# Patient Record
Sex: Female | Born: 1960 | Race: Black or African American | Hispanic: No | Smoking: Never smoker
Health system: Southern US, Community
[De-identification: ages and names within clinical notes are randomized; demographics above are authoritative.]

## PROBLEM LIST (undated history)

## (undated) DIAGNOSIS — F32A Depression, unspecified: Secondary | ICD-10-CM

## (undated) DIAGNOSIS — F419 Anxiety disorder, unspecified: Secondary | ICD-10-CM

## (undated) DIAGNOSIS — J449 Chronic obstructive pulmonary disease, unspecified: Secondary | ICD-10-CM

## (undated) DIAGNOSIS — T7840XA Allergy, unspecified, initial encounter: Secondary | ICD-10-CM

## (undated) DIAGNOSIS — M1990 Unspecified osteoarthritis, unspecified site: Secondary | ICD-10-CM

## (undated) DIAGNOSIS — D649 Anemia, unspecified: Secondary | ICD-10-CM

## (undated) HISTORY — DX: Unspecified osteoarthritis, unspecified site: M19.90

## (undated) HISTORY — DX: Chronic obstructive pulmonary disease, unspecified: J44.9

## (undated) HISTORY — DX: Anxiety disorder, unspecified: F41.9

## (undated) HISTORY — DX: Allergy, unspecified, initial encounter: T78.40XA

## (undated) HISTORY — PX: BREAST CYST ASPIRATION: SHX578

## (undated) HISTORY — DX: Anemia, unspecified: D64.9

## (undated) HISTORY — DX: Depression, unspecified: F32.A

## (undated) HISTORY — PX: TUBAL LIGATION: SHX77

## (undated) HISTORY — PX: ABDOMINAL HYSTERECTOMY: SHX81

---

## 1997-11-20 ENCOUNTER — Ambulatory Visit (HOSPITAL_COMMUNITY): Admission: RE | Admit: 1997-11-20 | Discharge: 1997-11-20 | Payer: Self-pay | Admitting: Family Medicine

## 1997-12-05 ENCOUNTER — Ambulatory Visit (HOSPITAL_COMMUNITY): Admission: RE | Admit: 1997-12-05 | Discharge: 1997-12-05 | Payer: Self-pay | Admitting: *Deleted

## 2010-06-17 ENCOUNTER — Emergency Department (HOSPITAL_COMMUNITY)
Admission: EM | Admit: 2010-06-17 | Discharge: 2010-06-18 | Payer: Self-pay | Source: Home / Self Care | Admitting: Emergency Medicine

## 2010-07-07 ENCOUNTER — Emergency Department (HOSPITAL_COMMUNITY)
Admission: EM | Admit: 2010-07-07 | Discharge: 2010-07-07 | Payer: Self-pay | Source: Home / Self Care | Admitting: Emergency Medicine

## 2010-07-08 LAB — CBC
HCT: 40.3 % (ref 36.0–46.0)
Hemoglobin: 13.1 g/dL (ref 12.0–15.0)
MCH: 30.5 pg (ref 26.0–34.0)
MCHC: 32.5 g/dL (ref 30.0–36.0)
MCV: 93.7 fL (ref 78.0–100.0)
Platelets: 322 10*3/uL (ref 150–400)
RBC: 4.3 MIL/uL (ref 3.87–5.11)
RDW: 12.6 % (ref 11.5–15.5)
WBC: 7.9 10*3/uL (ref 4.0–10.5)

## 2010-07-08 LAB — POCT CARDIAC MARKERS
CKMB, poc: <1 ng/mL — ABNORMAL LOW (ref 1.0–8.0)
Myoglobin, poc: 30.6 ng/mL (ref 12–200)
Troponin i, poc: <0.05 ng/mL (ref 0.00–0.09)

## 2010-07-08 LAB — BASIC METABOLIC PANEL
BUN: 11 mg/dL (ref 6–23)
CO2: 28 mEq/L (ref 19–32)
Calcium: 9.3 mg/dL (ref 8.4–10.5)
Chloride: 100 mEq/L (ref 96–112)
Creatinine, Ser: 0.67 mg/dL (ref 0.4–1.2)
GFR calc Af Amer: >60 mL/min (ref >60)
GFR calc non Af Amer: >60 mL/min (ref >60)
Glucose, Bld: 98 mg/dL (ref 70–99)
Potassium: 4 mEq/L (ref 3.5–5.1)
Sodium: 134 mEq/L — ABNORMAL LOW (ref 135–145)

## 2010-07-08 LAB — DIFFERENTIAL
Basophils Absolute: 0.1 10*3/uL (ref 0.0–0.1)
Basophils Relative: 1 % (ref 0–1)
Eosinophils Absolute: 0.8 10*3/uL — ABNORMAL HIGH (ref 0.0–0.7)
Eosinophils Relative: 10 % — ABNORMAL HIGH (ref 0–5)
Lymphocytes Relative: 27 % (ref 12–46)
Lymphs Abs: 2.1 10*3/uL (ref 0.7–4.0)
Monocytes Absolute: 0.6 10*3/uL (ref 0.1–1.0)
Monocytes Relative: 8 % (ref 3–12)
Neutro Abs: 4.4 10*3/uL (ref 1.7–7.7)
Neutrophils Relative %: 55 % (ref 43–77)

## 2010-08-31 LAB — BASIC METABOLIC PANEL
BUN: 19 mg/dL (ref 6–23)
CO2: 27 mEq/L (ref 19–32)
Calcium: 9.1 mg/dL (ref 8.4–10.5)
Chloride: 105 mEq/L (ref 96–112)
Creatinine, Ser: 0.5 mg/dL (ref 0.4–1.2)
GFR calc Af Amer: >60 mL/min (ref >60)
GFR calc non Af Amer: >60 mL/min (ref >60)
Glucose, Bld: 98 mg/dL (ref 70–99)
Potassium: 3.8 mEq/L (ref 3.5–5.1)
Sodium: 138 mEq/L (ref 135–145)

## 2010-08-31 LAB — CBC
HCT: 36.4 % (ref 36.0–46.0)
Hemoglobin: 12.1 g/dL (ref 12.0–15.0)
MCH: 31.8 pg (ref 26.0–34.0)
MCHC: 33.2 g/dL (ref 30.0–36.0)
MCV: 95.8 fL (ref 78.0–100.0)
Platelets: 296 K/uL (ref 150–400)
RBC: 3.8 MIL/uL — ABNORMAL LOW (ref 3.87–5.11)
RDW: 12.6 % (ref 11.5–15.5)
WBC: 7.1 K/uL (ref 4.0–10.5)

## 2013-09-27 ENCOUNTER — Emergency Department (HOSPITAL_COMMUNITY): Admission: EM | Admit: 2013-09-27 | Discharge: 2013-09-27 | Discharge status: Against Medical Advice | Payer: Self-pay

## 2013-09-27 ENCOUNTER — Emergency Department (HOSPITAL_COMMUNITY)
Admission: EM | Admit: 2013-09-27 | Discharge: 2013-09-27 | Disposition: A | Discharge status: Home / Self Care | Payer: Self-pay | Attending: Emergency Medicine | Admitting: Emergency Medicine

## 2013-09-27 ENCOUNTER — Emergency Department (HOSPITAL_COMMUNITY): Payer: Self-pay

## 2013-09-27 ENCOUNTER — Encounter (HOSPITAL_COMMUNITY): Payer: Self-pay | Admitting: Emergency Medicine

## 2013-09-27 DIAGNOSIS — H53149 Visual discomfort, unspecified: Secondary | ICD-10-CM | POA: Insufficient documentation

## 2013-09-27 DIAGNOSIS — H0520 Unspecified exophthalmos: Secondary | ICD-10-CM | POA: Insufficient documentation

## 2013-09-27 DIAGNOSIS — H5789 Other specified disorders of eye and adnexa: Secondary | ICD-10-CM | POA: Insufficient documentation

## 2013-09-27 DIAGNOSIS — Y93E8 Activity, other personal hygiene: Secondary | ICD-10-CM | POA: Insufficient documentation

## 2013-09-27 DIAGNOSIS — Z7982 Long term (current) use of aspirin: Secondary | ICD-10-CM | POA: Insufficient documentation

## 2013-09-27 DIAGNOSIS — S0502XA Injury of conjunctiva and corneal abrasion without foreign body, left eye, initial encounter: Secondary | ICD-10-CM

## 2013-09-27 DIAGNOSIS — X58XXXA Exposure to other specified factors, initial encounter: Secondary | ICD-10-CM | POA: Insufficient documentation

## 2013-09-27 DIAGNOSIS — S058X9A Other injuries of unspecified eye and orbit, initial encounter: Secondary | ICD-10-CM | POA: Insufficient documentation

## 2013-09-27 DIAGNOSIS — Y929 Unspecified place or not applicable: Secondary | ICD-10-CM | POA: Insufficient documentation

## 2013-09-27 DIAGNOSIS — H5712 Ocular pain, left eye: Secondary | ICD-10-CM

## 2013-09-27 MED ORDER — MOXIFLOXACIN HCL 0.5 % OP SOLN: 1.0000 [drp] | Freq: Three times a day (TID) | OPHTHALMIC | Status: AC

## 2013-09-27 MED ORDER — FLUORESCEIN SODIUM 1 MG OP STRP
1.0000 | ORAL_STRIP | Freq: Once | OPHTHALMIC | Status: AC
  Administered 2013-09-27: 1 via OPHTHALMIC
  Filled 2013-09-27: qty 1

## 2013-09-27 MED ORDER — TETRACAINE HCL 0.5 % OP SOLN
2.0000 [drp] | Freq: Once | OPHTHALMIC | Status: AC
  Administered 2013-09-27: 2 [drp] via OPHTHALMIC
  Filled 2013-09-27: qty 2

## 2013-09-27 NOTE — Discharge Instructions (Signed)
Today you were evaluated for left eye pain and found to have a corneal abrasion which should be treated with antibiotic drops that were prescribed today. You may take tylenol and ibuprofen as needed for pain.  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 This could indicate thyroid problems.  It is important to follow up with a primary care provider to have thyroid levels and other basic blood work checked, see resource guide below if you do not have one.   If symptoms not improving in 2-3 days, follow up with Dr. Allyne GeeSanders, ophthalmology.     Emergency Department Resource Guide 1) Find a Doctor and Pay Out of Pocket Although you won't have to find out who is covered by your insurance plan, it is a good idea to ask around and get recommendations. You will then need to call the office and see if the doctor you have chosen will accept you as a new patient and what types of options they offer for patients who are self-pay. Some doctors offer discounts or will set up payment plans for their patients who do not have insurance, but you will need to ask so you aren't surprised when you get to your appointment.  2) Contact Your Local Health Department Not all health departments have doctors that can see patients for sick visits, but many do, so it is worth a call to see if yours does. If you don't know where your local health department is, you can check in your phone book. The CDC also has a tool to help you locate your state's health department, and many state websites also have listings of all of their local health departments.  3) Find a Walk-in Clinic If your illness is not likely to be very severe or complicated, you may want to try a walk in clinic. These are popping up all over the country in pharmacies, drugstores, and shopping centers. They're usually staffed by nurse practitioners or physician assistants that have been trained to treat common illnesses and complaints.  They're usually fairly quick and inexpensive. However, if you have serious medical issues or chronic medical problems, these are probably not your best option.  No Primary Care Doctor: - Call Health Connect at  [phone] - they can help you locate a primary care doctor that  accepts your insurance, provides certain services, etc. - Physician Referral Service- [phone]  Chronic Pain Problems: Organization         Address  Phone   Notes  [facility]  [phone] Patients need to be referred by their primary care doctor.   Medication Assistance: Organization         Address  Phone   Notes  [facility] [address] [place], [place] [address] [phone] --Must be a resident of [place] -- Must have NO insurance coverage whatsoever (no Medicaid/ Medicare, etc.) -- The pt. MUST have a primary care doctor that directs their care regularly and follows them in the community   [facility]  [phone]   [employer]  [phone]    Agencies that provide inexpensive medical care: Organization         Address  Phone                                                                            Notes  [facility]  [phone]   [facility]    [phone]   [facility] [address] [place], [place] [address] [phone]   [facility] [address], [place] [phone]   Planned Parenthood    [phone]   [facility]    [phone]   [facility] and [facility]  [address], [place] Phone:  [phone], Fax:  [phone] Hours of Operation:  9 am - 6 pm, M-F.  Also accepts Medicaid/Medicare and self-pay.  [facility]  [address], [place] Phone: [phone], Fax: [phone]. Hours of  Operation:  8:30 am - 5:30 pm, M-F.  Also accepts Medicaid and self-pay.  [facility] [place] [address], [place] Phone: [phone]   [facility] [address] Natasha Bence [place], [place] [phone], Ext. [phone] Mondays & Thursdays: 7-9 AM.  First 15 patients are seen on a first come, first serve basis.    Medicaid-accepting [facility] Providers:  Organization         Address                                                                       Phone                               Notes  [facility] [address], [place] [phone] Also accepts self-pay patients.  [facility] [address] Laurell Josephs [place], [place]  [phone]   [facility] [address], [place] [phone]   [facility] [address] [place] [phone]   Renaye Rakers [address], [place]   [phone] Only accepts [place] patients after they have their name applied to their card.   Self-Pay (no insurance) in [facility]:   Organization         Address                                                     Phone               Notes  Sickle Cell Patients, [facility] [address] [place], [place] [phone]   [facility] [address] [place], [place] [phone]   [facility] [place]  [address], [place] [phone]   [facility]/Dr. Osei-Bonsu  [address] [place] [address], [place] or [address], [place] [phone] Phone number for both [place] and [place] locations is the same.  [facility] and [facility] [address], [place] [phone]   [facility] [address] [place] [address], [place] or [address] [phone]   [facility] [address], [place] [phone]  [phone], phone; [phone], fax Sees patients 1st and 3rd Saturday of every month.  Must not qualify for public or private insurance (i.e. Medicaid, Medicare, [place] Health Choice, Veterans' Benefits)  Household income should be no more than 200% of the poverty level The clinic cannot treat you if you are pregnant or think you are pregnant  Sexually transmitted diseases are not treated at the clinic.    Dental Care: Organization         Address                                  Phone                       Notes  [facility] [address], [place] [phone] Accepts children up to age 51 who are enrolled in [place] or [place] Health Choice; pregnant women with a Medicaid card; and children who have applied for Medicaid or [place] Health Choice, but were declined, whose parents can pay a reduced fee at time of service.  [facility] [place]  [address], [place] [phone] Accepts children up to age 51 who are enrolled in [place] or [place] Health Choice; pregnant women with a Medicaid card; and children who have applied for Medicaid or [place] Health Choice, but were declined, whose parents can pay a reduced fee at time of service.  [facility]  [address], [place] [phone] Patients are seen by appointment only. Walk-ins are not accepted. [facility] will see patients 51 years of age and older. Monday - Tuesday (8am-5pm) Most Wednesdays (8:30-5pm) $30 per visit, cash only  [facility]  [address], [facility] [place] [phone] Patients are seen by appointment only. Walk-ins are not accepted. [facility] will see patients 51 years of age and older. One Wednesday Evening (Monthly: Volunteer Based).  $30 per visit, cash only  [facility]  [phone] for adults; Children under age 534, call Graduate  Pediatric Dentistry at [phone]. Children aged 704-14, please call [phone] to request a pediatric application.  Dental services are provided in all areas of dental care including fillings, crowns and bridges, complete and partial dentures, implants, gum treatment, root canals, and extractions. Preventive care is also provided. Treatment is provided to both adults and children. Patients are selected via a lottery and there is often a waiting list.   [facility] [address], [place]  [phone] [email or web address]   [facility] [address], [place], [place] [phone], Ext. [phone] Second and Fourth Thursday of each month, opens at 6:30 AM; Clinic ends at 9 AM.  Patients are seen on a first-come first-served basis, and a limited number are seen during each clinic.   [facility]  [address], [place], [place] [phone]   Eligibility Requirements You must have lived in [place], [place], or [place] for at least the last three months.   You cannot be eligible for state or federal sponsored [place] Cityhealthcare insurance, including CIGNAVeterans Administration, [place]icaid, or [employer]icare.   You generally cannot be eligible for healthcare insurance through your employer.    How to apply: Eligibility screenings are held every Tuesday and Wednesday afternoon from 1:00 pm until 4:00 pm. You do not need an appointment for the interview!  [facility] [address], [place], [place] [phone]   [facility]  [phone]   [facility]  [phone]   [facility]  [phone]

## 2013-09-27 NOTE — ED Notes (Signed)
Patient went to CT - okay'd by [place]rin, [place].   Patient is back at this time.

## 2013-09-27 NOTE — ED Notes (Signed)
Pt states that her eye popped out and that it popped back in on way other. Eye is red. States she feels like it is burning

## 2013-09-27 NOTE — ED Provider Notes (Signed)
CSN: [id number]     Arrival date & time 09/27/13  0534 History   First MD Initiated Contact with Patient 09/27/13 [phone]     Chief Complaint  Patient presents with  . Eye Problem     (Consider location/radiation/quality/duration/timing/severity/associated sxs/prior Treatment) HPI Pt is a 53yo female c/o left eye pain that started this morning while she was washing her face. Pt states she lowered her left lower eyelid when her "eye popped out and popped back in" on way over to the ER.  Pt states pain is constant, throbbing, 8/10, worse with eye movement. Pt states it feels like there is sand in her eye.  She has not tried anything for pain prior to arrival. Pt states light also makes pain worse.  She does not wear contacts and denies direct trauma to eye. Pt does wear reading glasses.  Denies hx of similar symptoms.   No past medical history on file. No past surgical history on file. No family history on file. History  Substance Use Topics  . Smoking status: Never Smoker   . Smokeless tobacco: Never Used  . Alcohol Use: No   OB History   Grav Para Term Preterm Abortions TAB SAB Ect Mult Living                 Review of Systems  Eyes: Positive for photophobia, pain, redness and visual disturbance. Negative for discharge and itching.  Neurological: Negative for dizziness, light-headedness and headaches.  All other systems reviewed and are negative.     Allergies  Bactrim and Flagyl  Home Medications   Current Outpatient Rx  Name  Route  Sig  Dispense  Refill  . aspirin EC 81 MG tablet   Oral   Take 324 mg by mouth daily as needed.          . Aspirin-Salicylamide-Caffeine (BC HEADACHE POWDER PO)   Oral   Take 1 packet by mouth 2 (two) times daily as needed (headache).         . moxifloxacin (VIGAMOX) 0.5 % ophthalmic solution   Ophthalmic   Apply 1 drop to eye 3 (three) times daily. Place one drop in affected eye 3 times daily for 7 days   3 mL   0    BP 126/69   Pulse 66  Temp(Src) 98.3 F (36.8 C) (Oral)  Resp 18  SpO2 100% Physical Exam  Nursing note and vitals reviewed. Constitutional: She is oriented to person, place, and time. She appears well-developed and well-nourished.  HENT:  Head: Normocephalic and atraumatic.  Eyes: EOM and lids are normal. Pupils are equal, round, and reactive to light. Lids are everted and swept, no foreign bodies found. Right eye exhibits no discharge. Left eye exhibits no discharge. Right conjunctiva is injected. Right conjunctiva has no hemorrhage. Left conjunctiva is injected. Left conjunctiva has no hemorrhage. No scleral icterus. Right eye exhibits normal extraocular motion and no nystagmus. Left eye exhibits normal extraocular motion ( pain with eye movement) and no nystagmus.  Slit lamp exam:      The left eye shows corneal abrasion and fluorescein uptake.  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 L eye not more proctotic than R eye.  Tono pen: Difficult having pt cooperate for exam  Right: 21, 30, 27  Left: 50, 19,17   Neck: Normal range of motion. Neck supple.  Cardiovascular: Normal rate.   Pulmonary/Chest: Effort normal.  Musculoskeletal: Normal range of motion.  Lymphadenopathy:    She has no cervical  adenopathy.  Neurological: She is alert and oriented to person, place, and time.  Skin: Skin is warm and dry.  Psychiatric: She has a normal mood and affect. Her behavior is normal.    ED Course  Procedures (including critical care time) Labs Review Labs Reviewed - No data to display Imaging Review Ct Orbitss W/o Cm  09/27/2013   CLINICAL DATA:  53 year old female with eye pain. Initial encounter.  EXAM: CT ORBITS WITHOUT CONTRAST  TECHNIQUE: Multidetector CT imaging of the orbits was performed following the standard protocol without intravenous contrast.  COMPARISON:  None.  FINDINGS: Visualized paranasal sinuses and mastoids are clear. Negative skull and facial bones. Degenerative changes in the visible  upper cervical spine (right side facet arthropathy). Visualized scalp soft tissues are within normal limits. Negative visualized deep soft tissue spaces of the face. Visible non contrast brain parenchyma grossly normal.  Leftward gaze deviation. Globes are intact. No periorbital or intraorbital soft tissue stranding identified. Orbital walls are intact. Lacrimal glands within normal limits. No orbital soft tissue abnormality identified.  IMPRESSION: Negative noncontrast CT of the orbits.   Electronically Signed   By: Augusto Gamble M.D.   On: 09/27/2013 10:28     EKG Interpretation None      MDM   Final diagnoses:  Exophthalmos  Left eye pain  Injury of conjunctiva and corneal abrasion of left eye w/o FB    Pt c/o left eye pain after it "popped out" and then "back in" just PTA.  Denies trauma to eye.  On exam, EOM difficult for pt in left eye as pt has photosensitivity.  Corneal abrasion seen on exam.  Eye pressure difficult to assess given pt's inability to keep eyes open due to photosensitivity.    Consulted with Dr. Allyne Gee, ophthalmology who recommended treating corneal abrasion of left eye with Vigamox. No other tx is needed for eyes at this time. Recommends pt have thyroid levels checked by PCP as pt has bilateral exophthalmus. Advised to f/u with Dr. Allyne Gee if symptoms not improving. Return precautions provided. Pt verbalized understanding and agreement with tx plan.   Junius Finner, PA-C 09/27/13 1502

## 2013-09-27 NOTE — ED Notes (Signed)
DO NOT LET PATIENT GO TO CT until Erin, [place] has talked with opthalmology.

## 2013-09-27 NOTE — Discharge Planning (Signed)
[facility] Erin Doyle, [employer] Liaison  Spoke to patient about primary care resources and establishing care with a provider. Patient was given the orange card application and resource guide. My contact information was also provided for any future questions or concerns.

## 2013-09-28 NOTE — ED Provider Notes (Signed)
Medical screening examination/treatment/procedure(s) were performed by non-physician practitioner and as supervising physician I was immediately available for consultation/collaboration.   EKG Interpretation None       Teara Duerksen K Mekenna Finau-Rasch, MD 09/28/13 2302 

## 2015-10-25 IMAGING — CT CT ORBITS W/O CM
3 series · 13 of 47 positions shown, 15 images · non-contrast
Comparison: None.

CLINICAL DATA: 52-year-old female with eye pain. Initial encounter.

EXAM:
CT ORBITS WITHOUT CONTRAST
TECHNIQUE: Multidetector CT imaging of the orbits was performed following the
standard protocol without intravenous contrast.

[Series 2: facial/ orbits 2.0 h30s · axial · 0.38mm/px · z∈[-150,-82]mm · 7 of 42 slices shown, 9 images]
[im 5/42  brain]
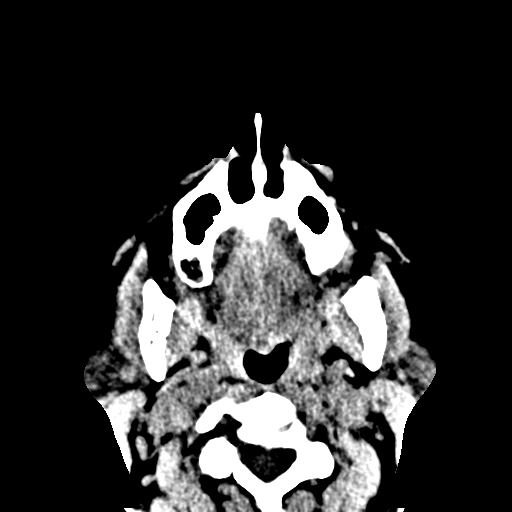
[im 5/42  bone]
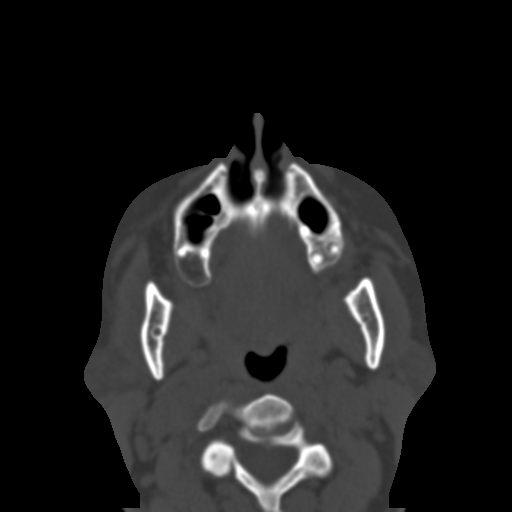
[im 10/42  bone]
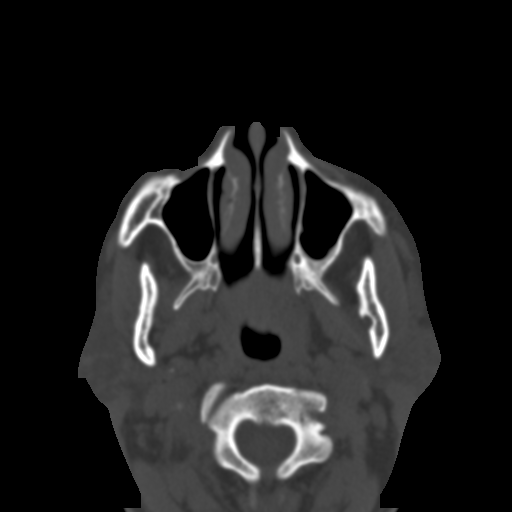
[im 16/42  bone]
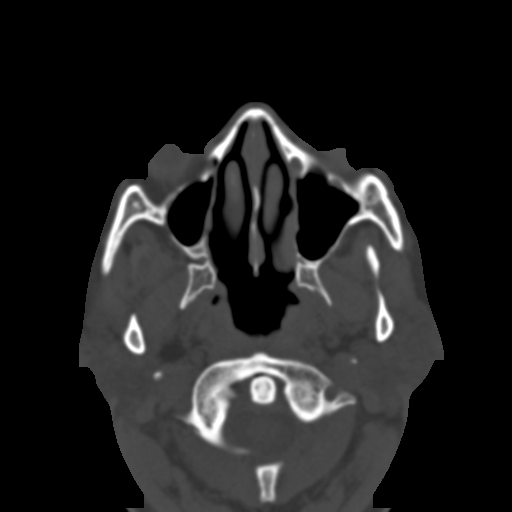
[im 22/42  bone]
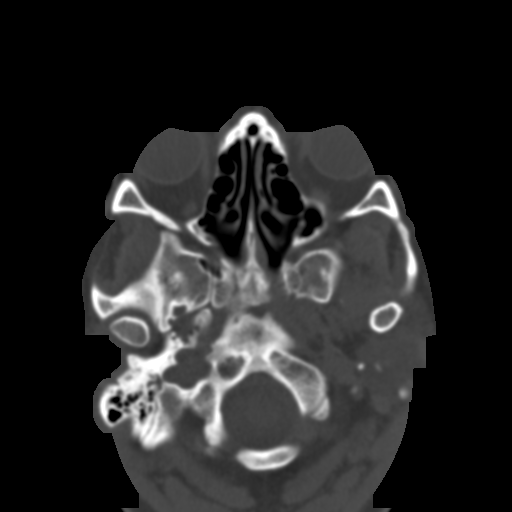
[im 27/42  brain]
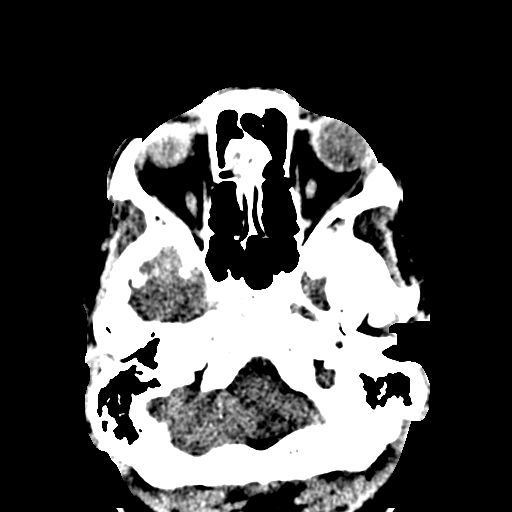
[im 27/42  bone]
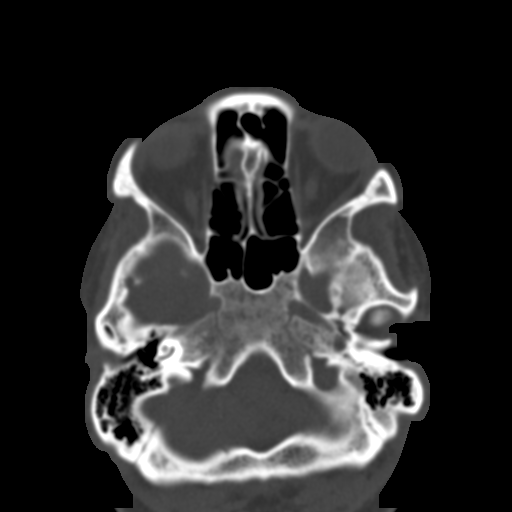
[im 33/42  bone]
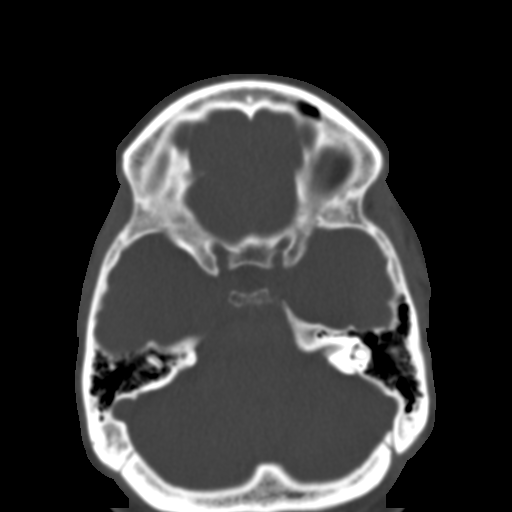
[im 39/42  bone]
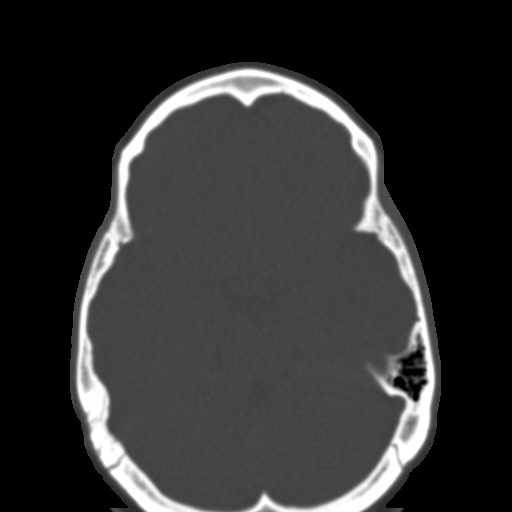

[Series 4: coronal st · coronal · 0.19mm/px · 3 of 76 slices shown]
[im 26/76  bone]
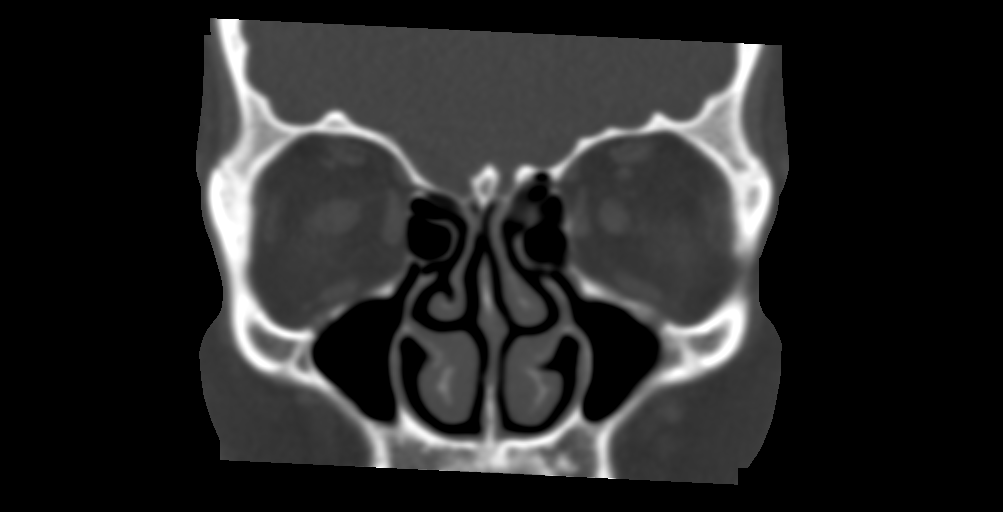
[im 34/76  bone]
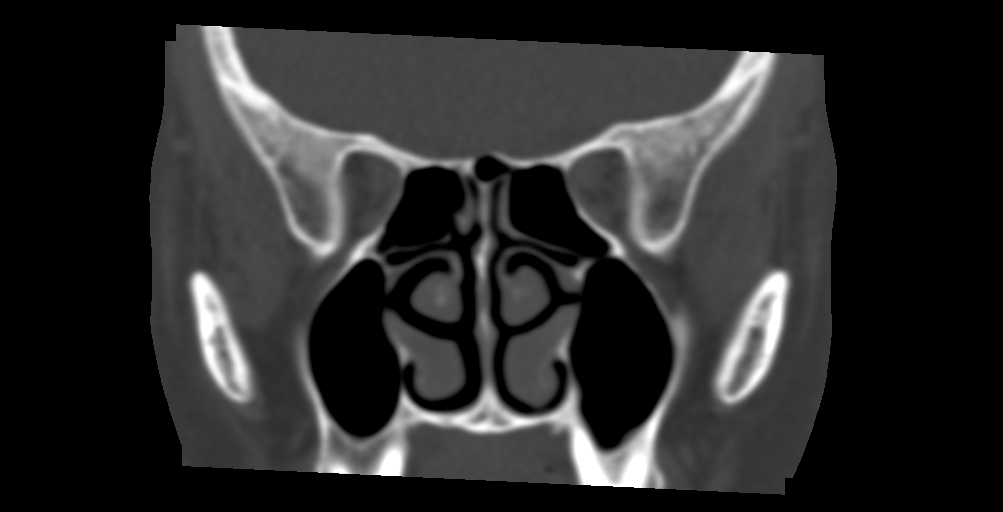
[im 42/76  bone]
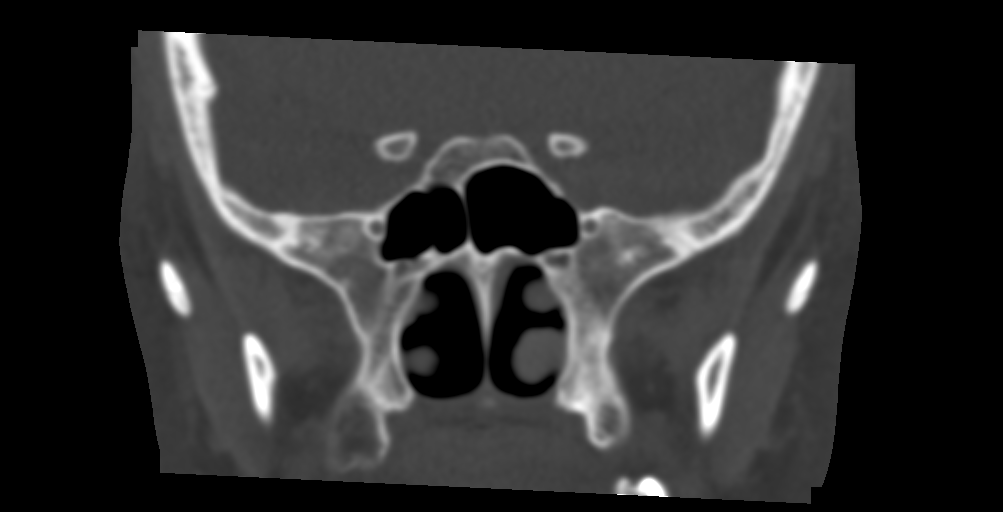

[Series 5: sagittal st · sagittal · 0.19mm/px · 3 of 78 slices shown]
[im 26/78  bone]
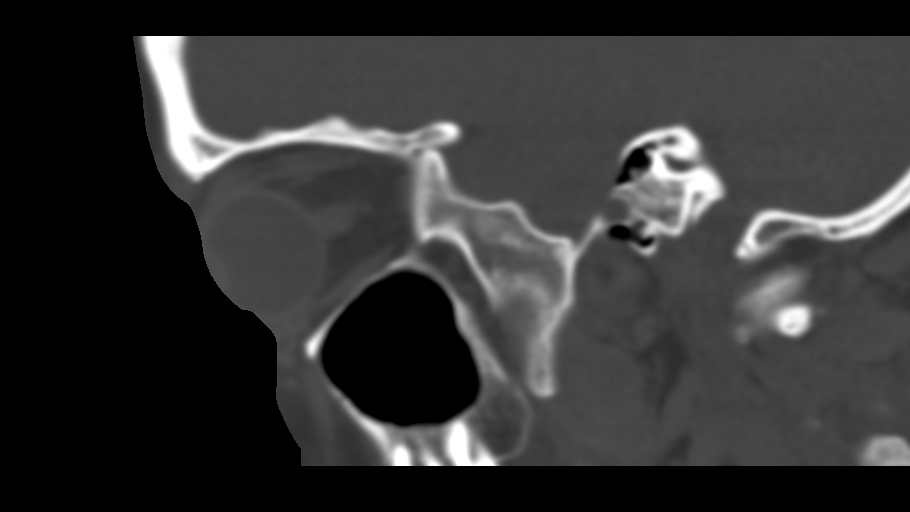
[im 39/78  bone]
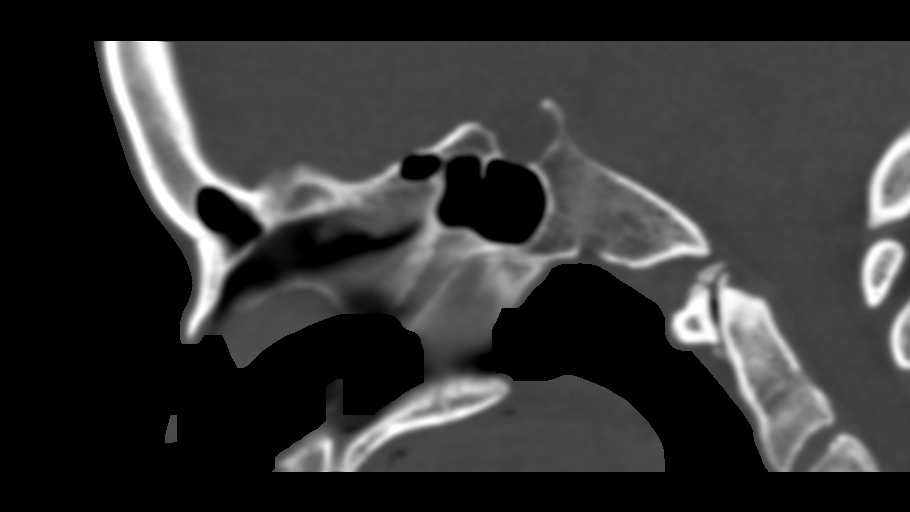
[im 52/78  bone]
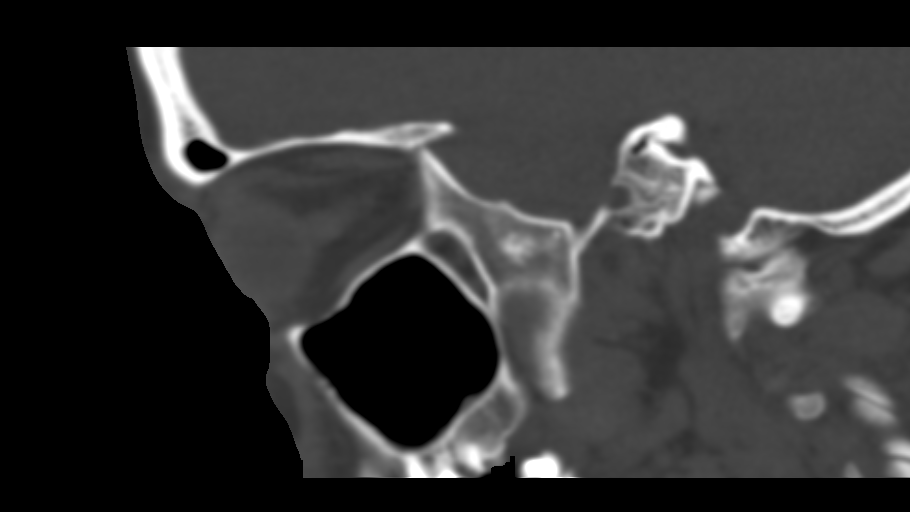

[13 of 47 positions shown; findings below may reference images not displayed]

FINDINGS: Visualized paranasal sinuses and mastoids are clear. Negative skull
and facial bones. Degenerative changes in the visible upper cervical
spine (right side facet arthropathy). Visualized scalp soft tissues
are within normal limits. Negative visualized deep soft tissue
spaces of the face. Visible non contrast brain parenchyma grossly
normal.

Leftward gaze deviation. Globes are intact. No periorbital or
intraorbital soft tissue stranding identified. Orbital walls are
intact. Lacrimal glands within normal limits. No orbital soft tissue
abnormality identified.
IMPRESSION: Negative noncontrast CT of the orbits.

## 2019-09-28 ENCOUNTER — Ambulatory Visit: Payer: Self-pay | Attending: Internal Medicine

## 2019-09-28 DIAGNOSIS — Z23 Encounter for immunization: Secondary | ICD-10-CM

## 2019-09-28 NOTE — Progress Notes (Signed)
   Covid-19 Vaccination Clinic  Name:  Erin Doyle    MRN: [id number] DOB: [birth year]  09/28/2019  [name] was observed post Covid-19 immunization for 15 minutes without incident. She was provided with Vaccine Information Sheet and instruction to access the V-Safe system.   [name] was instructed to call 911 with any severe reactions post vaccine: Marland Kitchen Difficulty breathing  . Swelling of face and throat  . A fast heartbeat  . A bad rash all over body  . Dizziness and weakness   Immunizations Administered    Name Date Dose VIS Date Route   Pfizer COVID-19 Vaccine 09/28/2019 10:10 AM 0.3 mL 06/01/2019 Intramuscular   Manufacturer: [employer], [employer]   Lot: [id number]   NDC: [address]-7

## 2019-10-22 ENCOUNTER — Ambulatory Visit: Payer: Self-pay | Attending: Internal Medicine

## 2019-10-22 DIAGNOSIS — Z23 Encounter for immunization: Secondary | ICD-10-CM

## 2019-10-22 NOTE — Progress Notes (Signed)
   Covid-19 Vaccination Clinic  Name:  Kasie Leccese    MRN: [id number] DOB: 12/28/1960  10/22/2019  Ms. Haynes was observed post Covid-19 immunization for 15 minutes without incident. She was provided with Vaccine Information Sheet and instruction to access the V-Safe system.   Ms. Fuquay was instructed to call 911 with any severe reactions post vaccine: Marland Kitchen Difficulty breathing  . Swelling of face and throat  . A fast heartbeat  . A bad rash all over body  . Dizziness and weakness   Immunizations Administered    Name Date Dose VIS Date Route   Pfizer COVID-19 Vaccine 10/22/2019 10:43 AM 0.3 mL 08/15/2018 Intramuscular   Manufacturer: [employer], [employer]   Lot: [id number]   NDC: [address]-2

## 2023-12-26 ENCOUNTER — Other Ambulatory Visit (HOSPITAL_COMMUNITY): Payer: Self-pay

## 2024-04-02 ENCOUNTER — Ambulatory Visit (INDEPENDENT_AMBULATORY_CARE_PROVIDER_SITE_OTHER): Payer: Self-pay | Admitting: Nurse Practitioner

## 2024-04-02 ENCOUNTER — Encounter: Payer: Self-pay | Admitting: Nurse Practitioner

## 2024-04-02 VITALS — BP 164/99 | HR 78 | Ht 62.0 in | Wt 175.0 lb

## 2024-04-02 DIAGNOSIS — Z23 Encounter for immunization: Secondary | ICD-10-CM

## 2024-04-02 DIAGNOSIS — G43809 Other migraine, not intractable, without status migrainosus: Secondary | ICD-10-CM

## 2024-04-02 DIAGNOSIS — F32A Depression, unspecified: Secondary | ICD-10-CM

## 2024-04-02 DIAGNOSIS — Z1211 Encounter for screening for malignant neoplasm of colon: Secondary | ICD-10-CM

## 2024-04-02 DIAGNOSIS — Z0000 Encounter for general adult medical examination without abnormal findings: Secondary | ICD-10-CM

## 2024-04-02 DIAGNOSIS — Z1329 Encounter for screening for other suspected endocrine disorder: Secondary | ICD-10-CM

## 2024-04-02 DIAGNOSIS — Z1231 Encounter for screening mammogram for malignant neoplasm of breast: Secondary | ICD-10-CM

## 2024-04-02 DIAGNOSIS — F419 Anxiety disorder, unspecified: Secondary | ICD-10-CM

## 2024-04-02 DIAGNOSIS — Z13220 Encounter for screening for lipoid disorders: Secondary | ICD-10-CM

## 2024-04-02 MED ORDER — RIZATRIPTAN BENZOATE 10 MG PO TABS: 10.0000 mg | ORAL_TABLET | ORAL | 0 refills | Status: DC | PRN

## 2024-04-02 MED ORDER — ESTROVEN MENOPAUSE RELIEF 4 MG PO TABS: 1.0000 | ORAL_TABLET | Freq: Every day | ORAL | 2 refills | Status: AC

## 2024-04-02 MED ORDER — PAROXETINE HCL 10 MG PO TABS: 10.0000 mg | ORAL_TABLET | Freq: Every day | ORAL | 2 refills | Status: DC

## 2024-04-02 MED ORDER — CYCLOBENZAPRINE HCL 10 MG PO TABS: 10.0000 mg | ORAL_TABLET | Freq: Three times a day (TID) | ORAL | 0 refills | Status: DC | PRN

## 2024-04-02 NOTE — Progress Notes (Signed)
 Subjective   Patient ID: Erin Doyle, female    DOB: Nov 11, 1960, 63 y.o.   MRN: [id number]  Chief Complaint  Patient presents with   New Patient (Initial Visit)     New patient , discuss having headaches and menopause , she stated that she  has taking otc medication hasn't helped.     Referring provider: No ref. provider found  Erin Doyle is a 63 y.o. female with Past Medical History: 2000: Allergy     Comment:  Flagel and Bactrim 1988: Anemia     Comment:  During pregnancy 2025: Anxiety ?: Arthritis     Comment:  Whole body 2004: COPD (chronic obstructive pulmonary disease) (HCC)     Comment:  Bronchitis 2024: Depression   HPI  Patient presents today to establish care.  She has been caring for her husband at home who has history of stroke.  She has neglected her own health while caring for him.  She states that she has been very stressed and is having migraine headaches now.  We will order Maxalt to take as needed.  We will order Flexeril to take as needed.  Patient also complains of hot flashes and menopausal symptoms.  We will trial Estroven.  We will trial Paxil for anxiety and depression plus symptom relief with menopause.  We will place a referral for patient to psychiatry.  Will place a referral for social worker for caregiver counseling.  Patient does need labs today. Denies f/c/s, n/v/d, hemoptysis, PND, leg swelling Denies chest pain or edema      Allergies  Allergen Reactions   Bactrim [Sulfamethoxazole-Trimethoprim] Hives    Severe stomach cramps    Flagyl [Metronidazole] Hives    Immunization History  Administered Date(s) Administered   Influenza, Seasonal, Injecte, Preservative Fre 04/02/2024   PFIZER(Purple Top)SARS-COV-2 Vaccination 09/28/2019, 10/22/2019   Tdap 04/02/2024    Tobacco History: Social History   Tobacco Use  Smoking Status Never  Smokeless Tobacco Never  Tobacco Comments   None   Counseling given: Not Answered Tobacco  comments: None   Outpatient Encounter Medications as of 04/02/2024  Medication Sig   Aspirin-Salicylamide-Caffeine (BC HEADACHE POWDER PO) Take by mouth.   cyclobenzaprine (FLEXERIL) 10 MG tablet Take 1 tablet (10 mg total) by mouth 3 (three) times daily as needed for muscle spasms.   PARoxetine (PAXIL) 10 MG tablet Take 1 tablet (10 mg total) by mouth daily.   Rhubarb (ESTROVEN MENOPAUSE RELIEF) 4 MG TABS Take 1 tablet by mouth daily.   rizatriptan (MAXALT) 10 MG tablet Take 1 tablet (10 mg total) by mouth as needed for migraine. May repeat in 2 hours if needed   Specialty Vitamins Products (MENOPAUSE SUPPORT PO)    moxifloxacin  (VIGAMOX ) 0.5 % ophthalmic solution Apply 1 drop to eye 3 (three) times daily. Place one drop in affected eye 3 times daily for 7 days   [DISCONTINUED] aspirin EC 81 MG tablet Take 324 mg by mouth daily as needed.    [DISCONTINUED] Aspirin-Salicylamide-Caffeine (BC HEADACHE POWDER PO) Take 1 packet by mouth 2 (two) times daily as needed (headache).   No facility-administered encounter medications on file as of 04/02/2024.    Review of Systems  Review of Systems  Constitutional: Negative.   HENT: Negative.    Cardiovascular: Negative.   Gastrointestinal: Negative.   Allergic/Immunologic: Negative.   Neurological: Negative.   Psychiatric/Behavioral: Negative.       Objective:   BP (!) 164/99   Pulse 78   Ht 5'  2 (1.575 m)   Wt 175 lb (79.4 kg)   SpO2 100%   BMI 32.01 kg/m   Wt Readings from Last 5 Encounters:  04/02/24 175 lb (79.4 kg)     Physical Exam Vitals and nursing note reviewed.  Constitutional:      General: She is not in acute distress.    Appearance: She is well-developed.  Cardiovascular:     Rate and Rhythm: Normal rate and regular rhythm.  Pulmonary:     Effort: Pulmonary effort is normal.     Breath sounds: Normal breath sounds.  Neurological:     Mental Status: She is alert and oriented to person, place, and time.        Assessment & Plan:   Need for diphtheria-tetanus-pertussis (Tdap) vaccine  Need for influenza vaccination -     Tdap vaccine greater than or equal to 7yo IM -     Flu vaccine trivalent PF, 6mos and older(Flulaval,Afluria,Fluarix,Fluzone)  Other migraine without status migrainosus, not intractable -     Rizatriptan Benzoate; Take 1 tablet (10 mg total) by mouth as needed for migraine. May repeat in 2 hours if needed  Dispense: 10 tablet; Refill: 0 -     Cyclobenzaprine HCl; Take 1 tablet (10 mg total) by mouth 3 (three) times daily as needed for muscle spasms.  Dispense: 30 tablet; Refill: 0  Anxiety and depression -     Ambulatory referral to Psychiatry -     AMB Referral [facility] Care Management  Thyroid disorder screen -     TSH  Lipid screening -     Lipid panel  Routine adult health maintenance -     CBC -     Comprehensive metabolic panel with GFR  Colon cancer screening -     Cologuard  Encounter for screening mammogram for malignant neoplasm of breast -     3D Screening Mammogram, Left and Right  Other orders -     PARoxetine HCl; Take 1 tablet (10 mg total) by mouth daily.  Dispense: 30 tablet; Refill: 2 -     Estroven Menopause Relief; Take 1 tablet by mouth daily.  Dispense: 90 tablet; Refill: 2     Return in about 6 months (around 10/01/2024).     Bascom GORMAN Borer, NP 04/02/2024

## 2024-04-03 LAB — CBC
Hematocrit: 38.7 % (ref 34.0–46.6)
Hemoglobin: 13.3 g/dL (ref 11.1–15.9)
MCH: 31.8 pg (ref 26.6–33.0)
MCHC: 34.4 g/dL (ref 31.5–35.7)
MCV: 93 fL (ref 79–97)
Platelets: 252 x10E3/uL (ref 150–450)
RBC: 4.18 x10E6/uL (ref 3.77–5.28)
RDW: 11.9 % (ref 11.7–15.4)
WBC: 6.8 x10E3/uL (ref 3.4–10.8)

## 2024-04-03 LAB — LIPID PANEL
Chol/HDL Ratio: 3.3 ratio (ref 0.0–4.4)
Cholesterol, Total: 205 mg/dL — ABNORMAL HIGH (ref 100–199)
HDL: 63 mg/dL (ref >39)
LDL Chol Calc (NIH): 121 mg/dL — ABNORMAL HIGH (ref 0–99)
Triglycerides: 120 mg/dL (ref 0–149)
VLDL Cholesterol Cal: 21 mg/dL (ref 5–40)

## 2024-04-03 LAB — COMPREHENSIVE METABOLIC PANEL WITH GFR
ALT: 43 IU/L — ABNORMAL HIGH (ref 0–32)
AST: 40 IU/L (ref 0–40)
Albumin: 4.7 g/dL (ref 3.8–4.9)
Alkaline Phosphatase: 121 IU/L (ref 49–135)
BUN/Creatinine Ratio: 26 (ref 12–28)
BUN: 18 mg/dL (ref 8–27)
Bilirubin Total: 0.3 mg/dL (ref 0.0–1.2)
CO2: 26 mmol/L (ref 20–29)
Calcium: 10.1 mg/dL (ref 8.7–10.3)
Chloride: 102 mmol/L (ref 96–106)
Creatinine, Ser: 0.68 mg/dL (ref 0.57–1.00)
Globulin, Total: 2.7 g/dL (ref 1.5–4.5)
Glucose: 100 mg/dL — ABNORMAL HIGH (ref 70–99)
Potassium: 4 mmol/L (ref 3.5–5.2)
Sodium: 140 mmol/L (ref 134–144)
Total Protein: 7.4 g/dL (ref 6.0–8.5)
eGFR: 100 mL/min/1.73 (ref >59)

## 2024-04-03 LAB — TSH: TSH: 1.58 u[IU]/mL (ref 0.450–4.500)

## 2024-04-04 ENCOUNTER — Telehealth: Payer: Self-pay

## 2024-04-04 NOTE — Progress Notes (Signed)
 Complex Care Management Note Care Guide Note  04/04/2024 Name: Erin Doyle MRN: [id number] DOB: [birth year]   Complex Care Management Outreach Attempts: An unsuccessful telephone outreach was attempted today to offer the patient information about available complex care management services.  Follow Up Plan:  Additional outreach attempts will be made to offer the patient complex care management information and services.   Encounter Outcome:  No Answer  .Debbe Fuse [facility], [facility] Guide  Direct Dial: [phone]  Fax [phone]

## 2024-04-05 ENCOUNTER — Ambulatory Visit: Payer: Self-pay | Admitting: Nurse Practitioner

## 2024-04-06 NOTE — Progress Notes (Signed)
 Complex Care Management Note Care Guide Note  04/06/2024 Name: Erin Doyle MRN: [id number] DOB: [birth year]   Complex Care Management Outreach Attempts: A second unsuccessful outreach was attempted today to offer the patient with information about available complex care management services.  Follow Up Plan:  Additional outreach attempts will be made to offer the patient complex care management information and services.   Encounter Outcome:  No Answer  .Debbe Fuse [facility], [facility] Guide  Direct Dial: [phone]  Fax [phone]

## 2024-04-09 ENCOUNTER — Other Ambulatory Visit: Payer: Self-pay | Admitting: Nurse Practitioner

## 2024-04-09 ENCOUNTER — Telehealth: Payer: Self-pay

## 2024-04-09 DIAGNOSIS — G43809 Other migraine, not intractable, without status migrainosus: Secondary | ICD-10-CM

## 2024-04-09 NOTE — Progress Notes (Signed)
 Complex Care Management Note  Care Guide Note 04/09/2024 Name: Erin Doyle MRN: [id number] DOB: 1961-04-10  Erin Doyle is a 63 y.o. year old female who sees Oley Bascom RAMAN, NP for primary care. I reached out to Romero Ryder by phone today to offer complex care management services.  Ms. Godette was given information about Complex Care Management services today including:   The Complex Care Management services include support from the care team which includes your Nurse Care Manager, Clinical Social Worker, or Pharmacist.  The Complex Care Management team is here to help remove barriers to the health concerns and goals most important to you. Complex Care Management services are voluntary, and the patient may decline or stop services at any time by request to their care team member.   Complex Care Management Consent Status: Patient agreed to services and verbal consent obtained.   Follow up plan:  Telephone appointment with complex care management team member scheduled for:  05/10/2024  Encounter Outcome:  Patient Scheduled  Debbe Fuse [facility], [facility] Guide  Direct Dial: [phone]  Fax [phone]

## 2024-04-10 ENCOUNTER — Other Ambulatory Visit: Payer: Self-pay | Admitting: Nurse Practitioner

## 2024-04-10 DIAGNOSIS — G43809 Other migraine, not intractable, without status migrainosus: Secondary | ICD-10-CM

## 2024-04-11 ENCOUNTER — Other Ambulatory Visit: Payer: Self-pay | Admitting: Nurse Practitioner

## 2024-04-11 DIAGNOSIS — G43809 Other migraine, not intractable, without status migrainosus: Secondary | ICD-10-CM

## 2024-04-12 ENCOUNTER — Other Ambulatory Visit: Payer: Self-pay | Admitting: Nurse Practitioner

## 2024-04-12 DIAGNOSIS — G43809 Other migraine, not intractable, without status migrainosus: Secondary | ICD-10-CM

## 2024-04-17 ENCOUNTER — Ambulatory Visit
Admission: RE | Admit: 2024-04-17 | Discharge: 2024-04-17 | Disposition: A | Discharge status: Home / Self Care | Source: Ambulatory Visit | Attending: Nurse Practitioner | Admitting: Nurse Practitioner

## 2024-04-23 ENCOUNTER — Other Ambulatory Visit: Payer: Self-pay | Admitting: Nurse Practitioner

## 2024-04-23 DIAGNOSIS — R928 Other abnormal and inconclusive findings on diagnostic imaging of breast: Secondary | ICD-10-CM

## 2024-04-26 ENCOUNTER — Other Ambulatory Visit: Payer: Self-pay | Admitting: Nurse Practitioner

## 2024-04-26 DIAGNOSIS — G43809 Other migraine, not intractable, without status migrainosus: Secondary | ICD-10-CM

## 2024-04-26 NOTE — Telephone Encounter (Signed)
 Please advise [facility]

## 2024-05-03 ENCOUNTER — Ambulatory Visit
Admission: RE | Admit: 2024-05-03 | Discharge: 2024-05-03 | Disposition: A | Discharge status: Home / Self Care | Source: Ambulatory Visit | Attending: Nurse Practitioner | Admitting: Nurse Practitioner

## 2024-05-03 ENCOUNTER — Ambulatory Visit
Admission: RE | Admit: 2024-05-03 | Discharge: 2024-05-03 | Disposition: A | Source: Ambulatory Visit | Attending: Nurse Practitioner

## 2024-05-03 DIAGNOSIS — R928 Other abnormal and inconclusive findings on diagnostic imaging of breast: Secondary | ICD-10-CM

## 2024-05-10 ENCOUNTER — Other Ambulatory Visit: Payer: Self-pay | Admitting: Licensed Clinical Social Worker

## 2024-05-10 NOTE — Patient Outreach (Signed)
 LCSW introduced self, explained role, and informed pt of referral to Complex Care Management. Patient identified stressors, including, chronic migraines and caregiver stress.   Patient endorsed improvement of symptoms due to medication management. She denies any adverse affects from prescribed medications and reports compliance. States depression/stress symptoms have decreased. Patient is participating in a care management program through the [place] and attends events that occur throughout the month in person/virtually. States she feels well supported at this time.   LCSW provided crisis intervention resources via email, for patient to have for future reference, if needed. PCP informed that patient is not interested in services at this time. Pt agreed to inform LCSW and/or PCP should any needs arise.

## 2024-05-15 ENCOUNTER — Other Ambulatory Visit: Payer: Self-pay | Admitting: Nurse Practitioner

## 2024-05-15 DIAGNOSIS — G43809 Other migraine, not intractable, without status migrainosus: Secondary | ICD-10-CM

## 2024-05-15 NOTE — Telephone Encounter (Signed)
 Please advise [facility]

## 2024-05-15 NOTE — Telephone Encounter (Signed)
 Copied from CRM [id number]. Topic: Clinical - Medication Refill >> May 15, 2024 12:20 PM Rachelle R wrote: Medication: rizatriptan  (MAXALT ) 10 MG tablet  (Is requesting to see if she can be prescribed more than 10 at a time as she seems to be going to the pharmacy every week. Says the muscle relaxer's make her sleep and she wakes up after with a headache)  Has the patient contacted their pharmacy? Yes, call dr  This is the patient's preferred pharmacy:  [facility] [address], [place] - [address] [place] [address] Phone: [phone] Fax: [phone]  Is this the correct pharmacy for this prescription? Yes If no, delete pharmacy and type the correct one.   Has the prescription been filled recently? No  Is the patient out of the medication? No, 1 left  Has the patient been seen for an appointment in the last year OR does the patient have an upcoming appointment? Yes  Can we respond through MyChart? Yes  Agent: Please be advised that Rx refills may take up to 3 business days. We ask that you follow-up with your pharmacy.

## 2024-05-18 ENCOUNTER — Other Ambulatory Visit: Payer: Self-pay | Admitting: Nurse Practitioner

## 2024-05-18 DIAGNOSIS — G43809 Other migraine, not intractable, without status migrainosus: Secondary | ICD-10-CM

## 2024-05-20 MED ORDER — RIZATRIPTAN BENZOATE 10 MG PO TABS: 10.0000 mg | ORAL_TABLET | ORAL | 0 refills | Status: DC | PRN

## 2024-05-21 NOTE — Telephone Encounter (Signed)
 Please advise [facility]

## 2024-05-29 ENCOUNTER — Other Ambulatory Visit: Payer: Self-pay | Admitting: Nurse Practitioner

## 2024-05-29 DIAGNOSIS — G43809 Other migraine, not intractable, without status migrainosus: Secondary | ICD-10-CM

## 2024-05-29 NOTE — Telephone Encounter (Signed)
 Please advise [facility]

## 2024-05-31 ENCOUNTER — Other Ambulatory Visit: Payer: Self-pay | Admitting: Nurse Practitioner

## 2024-05-31 DIAGNOSIS — G43809 Other migraine, not intractable, without status migrainosus: Secondary | ICD-10-CM

## 2024-05-31 NOTE — Telephone Encounter (Signed)
 Med was sent in yesterday.   Suzen Shove   CMA II

## 2024-06-10 ENCOUNTER — Other Ambulatory Visit: Payer: Self-pay | Admitting: Nurse Practitioner

## 2024-06-10 DIAGNOSIS — G43809 Other migraine, not intractable, without status migrainosus: Secondary | ICD-10-CM

## 2024-06-25 ENCOUNTER — Other Ambulatory Visit: Payer: Self-pay | Admitting: Nurse Practitioner

## 2024-06-25 NOTE — Telephone Encounter (Signed)
 Please Advise.  CB.

## 2024-06-25 NOTE — Telephone Encounter (Signed)
 PARoxetine  (PAXIL ) 10 MG tablet [Pharmacy Med Name: PARoxetine  HCl 10 MG Oral Tablet]

## 2024-07-04 ENCOUNTER — Other Ambulatory Visit: Payer: Self-pay | Admitting: Nurse Practitioner

## 2024-07-04 DIAGNOSIS — G43809 Other migraine, not intractable, without status migrainosus: Secondary | ICD-10-CM

## 2024-07-04 NOTE — Telephone Encounter (Signed)
 rizatriptan  (MAXALT ) 10 MG tablet [Pharmacy Med Name: Rizatriptan  Benzoate 10 MG Oral Tablet]

## 2024-07-04 NOTE — Telephone Encounter (Signed)
 Please advise [facility]

## 2024-07-12 ENCOUNTER — Other Ambulatory Visit: Payer: Self-pay | Admitting: Nurse Practitioner

## 2024-07-12 DIAGNOSIS — G43809 Other migraine, not intractable, without status migrainosus: Secondary | ICD-10-CM

## 2024-07-12 NOTE — Telephone Encounter (Signed)
 rizatriptan  (MAXALT ) 10 MG tablet [Pharmacy Med Name: Rizatriptan  Benzoate 10 MG Oral Tablet]

## 2024-07-12 NOTE — Telephone Encounter (Signed)
 Please advise [facility]

## 2024-07-20 ENCOUNTER — Other Ambulatory Visit: Payer: Self-pay | Admitting: Nurse Practitioner

## 2024-07-20 DIAGNOSIS — G43809 Other migraine, not intractable, without status migrainosus: Secondary | ICD-10-CM

## 2024-07-20 NOTE — Telephone Encounter (Signed)
 rizatriptan  (MAXALT ) 10 MG tablet [Pharmacy Med Name: Rizatriptan  Benzoate 10 MG Oral Tablet]

## 2024-07-21 ENCOUNTER — Other Ambulatory Visit: Payer: Self-pay | Admitting: Nurse Practitioner

## 2024-07-23 NOTE — Telephone Encounter (Signed)
 Please Advise.  CB.

## 2024-07-23 NOTE — Telephone Encounter (Signed)
 PARoxetine  (PAXIL ) 10 MG tablet [Pharmacy Med Name: PARoxetine  HCl 10 MG Oral Tablet]

## 2024-07-27 ENCOUNTER — Other Ambulatory Visit: Payer: Self-pay | Admitting: Nurse Practitioner

## 2024-07-27 DIAGNOSIS — G43809 Other migraine, not intractable, without status migrainosus: Secondary | ICD-10-CM

## 2024-07-27 NOTE — Telephone Encounter (Signed)
 Due to Bascom being out of the office.  Please advise [facility]

## 2024-10-01 ENCOUNTER — Ambulatory Visit: Payer: Self-pay | Admitting: Nurse Practitioner
